# Patient Record
Sex: Male | Born: 1995 | Race: White | Hispanic: No | Marital: Single | Smoking: Never smoker
Health system: Southern US, Community
[De-identification: ages and names within clinical notes are randomized; demographics above are authoritative.]

---
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
## 2016-01-13 IMAGING — CR DG ANKLE COMPLETE 3+V*R*
3 series · 3 of 3 positions shown · non-contrast
Comparison: None.

CLINICAL DATA: Pain after running. Twisted ankle. Symptoms for 6
weeks. Initial evaluation.

EXAM:
RIGHT ANKLE - COMPLETE 3+ VIEW

[view not recorded (1 of 3)]
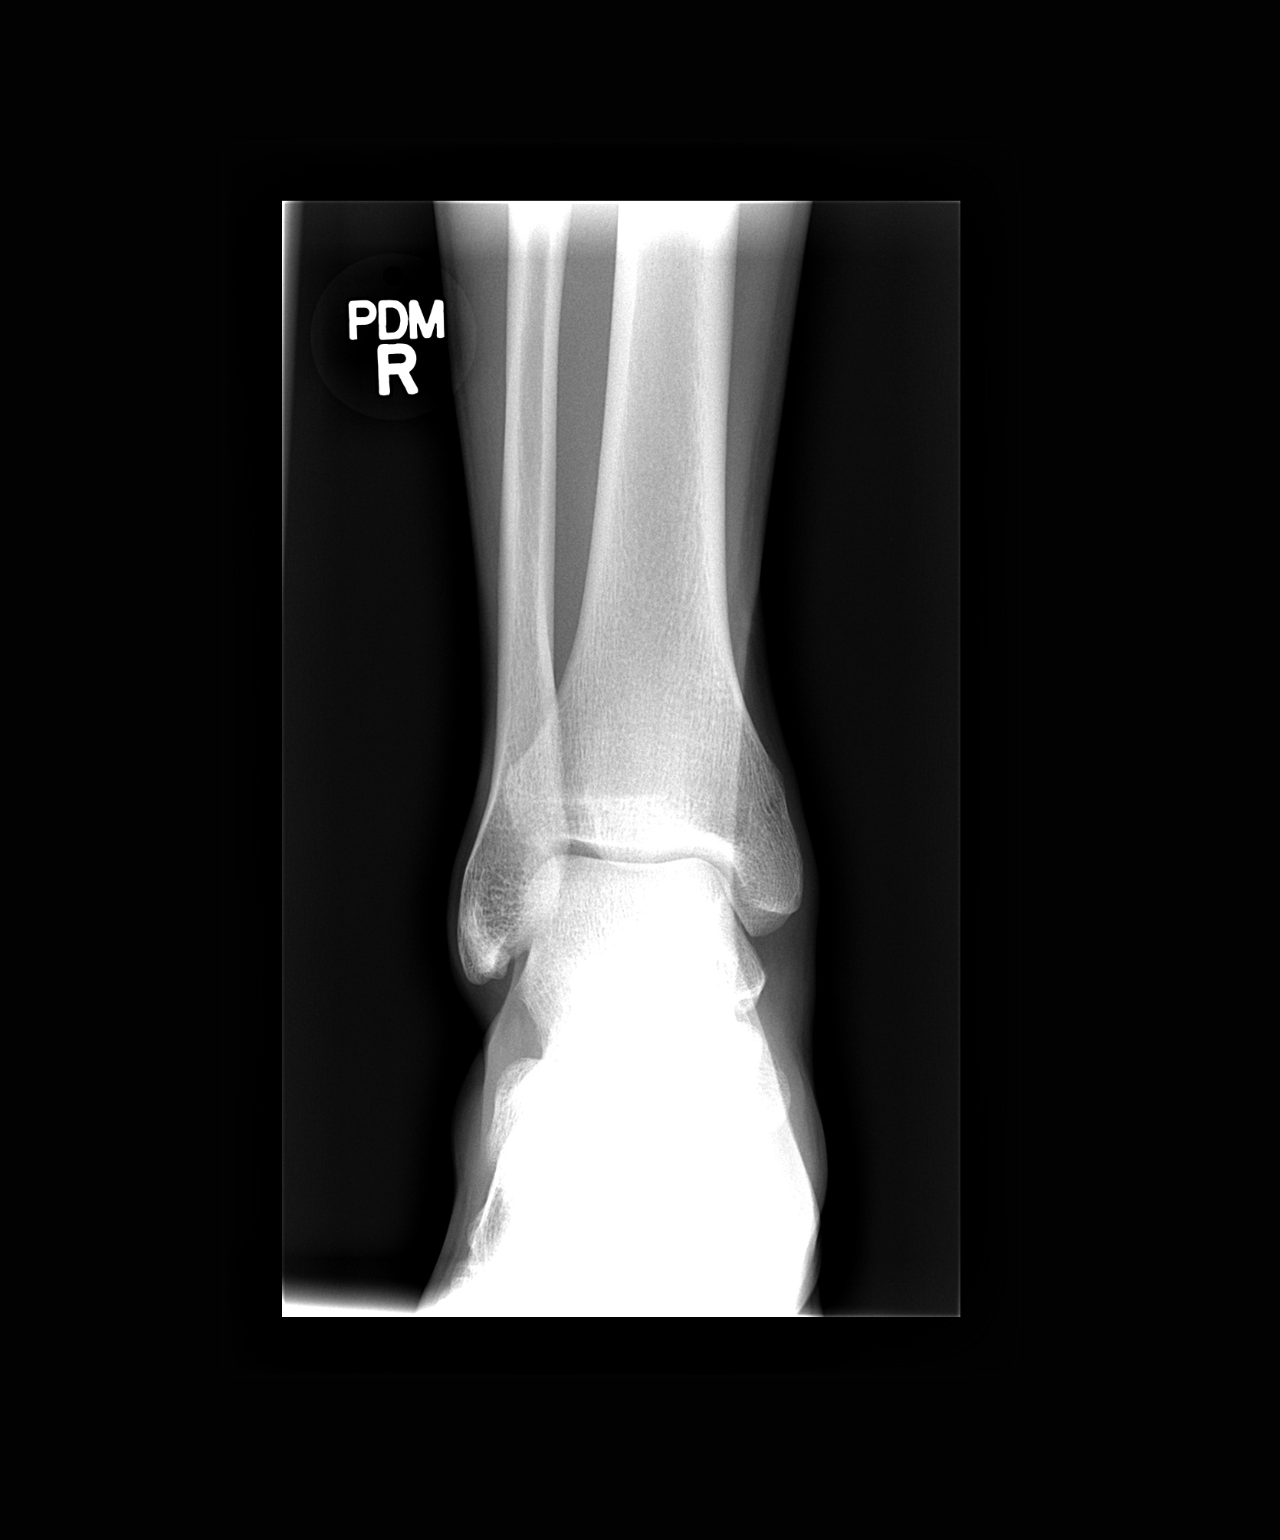

[view not recorded (2 of 3)]
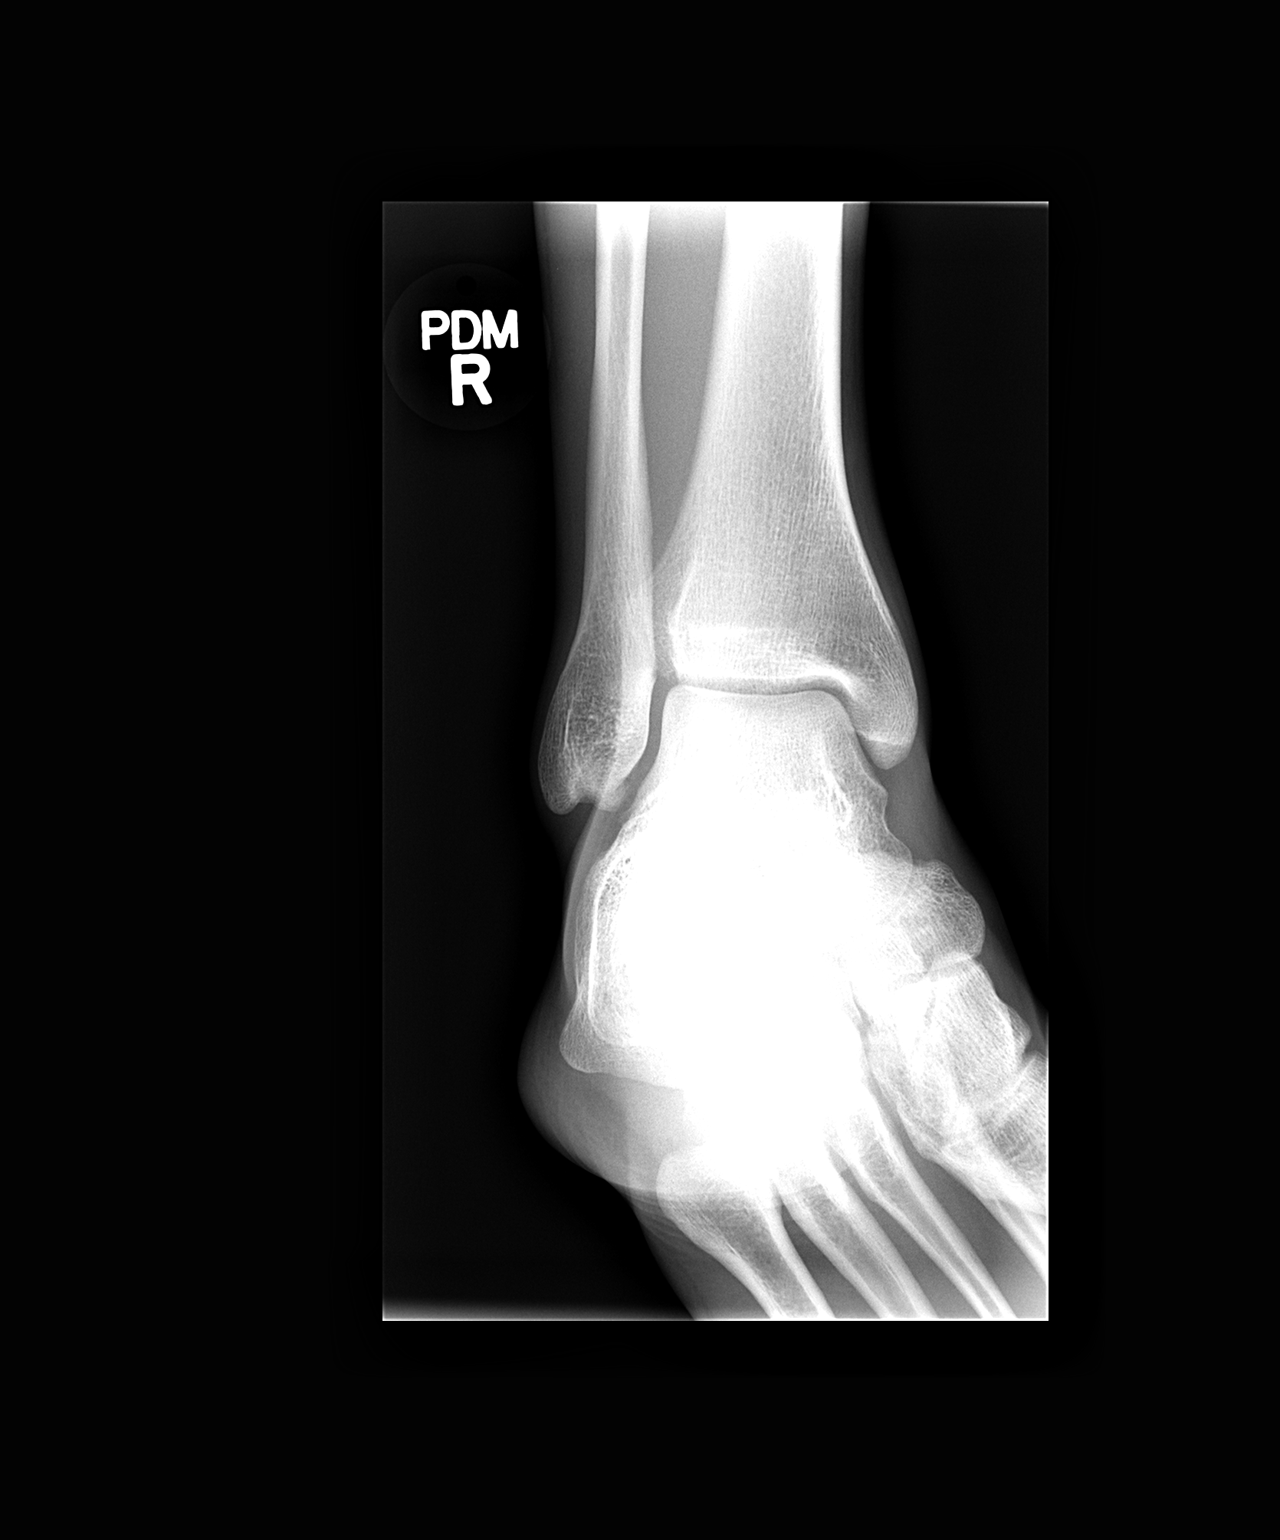

[view not recorded (3 of 3)]
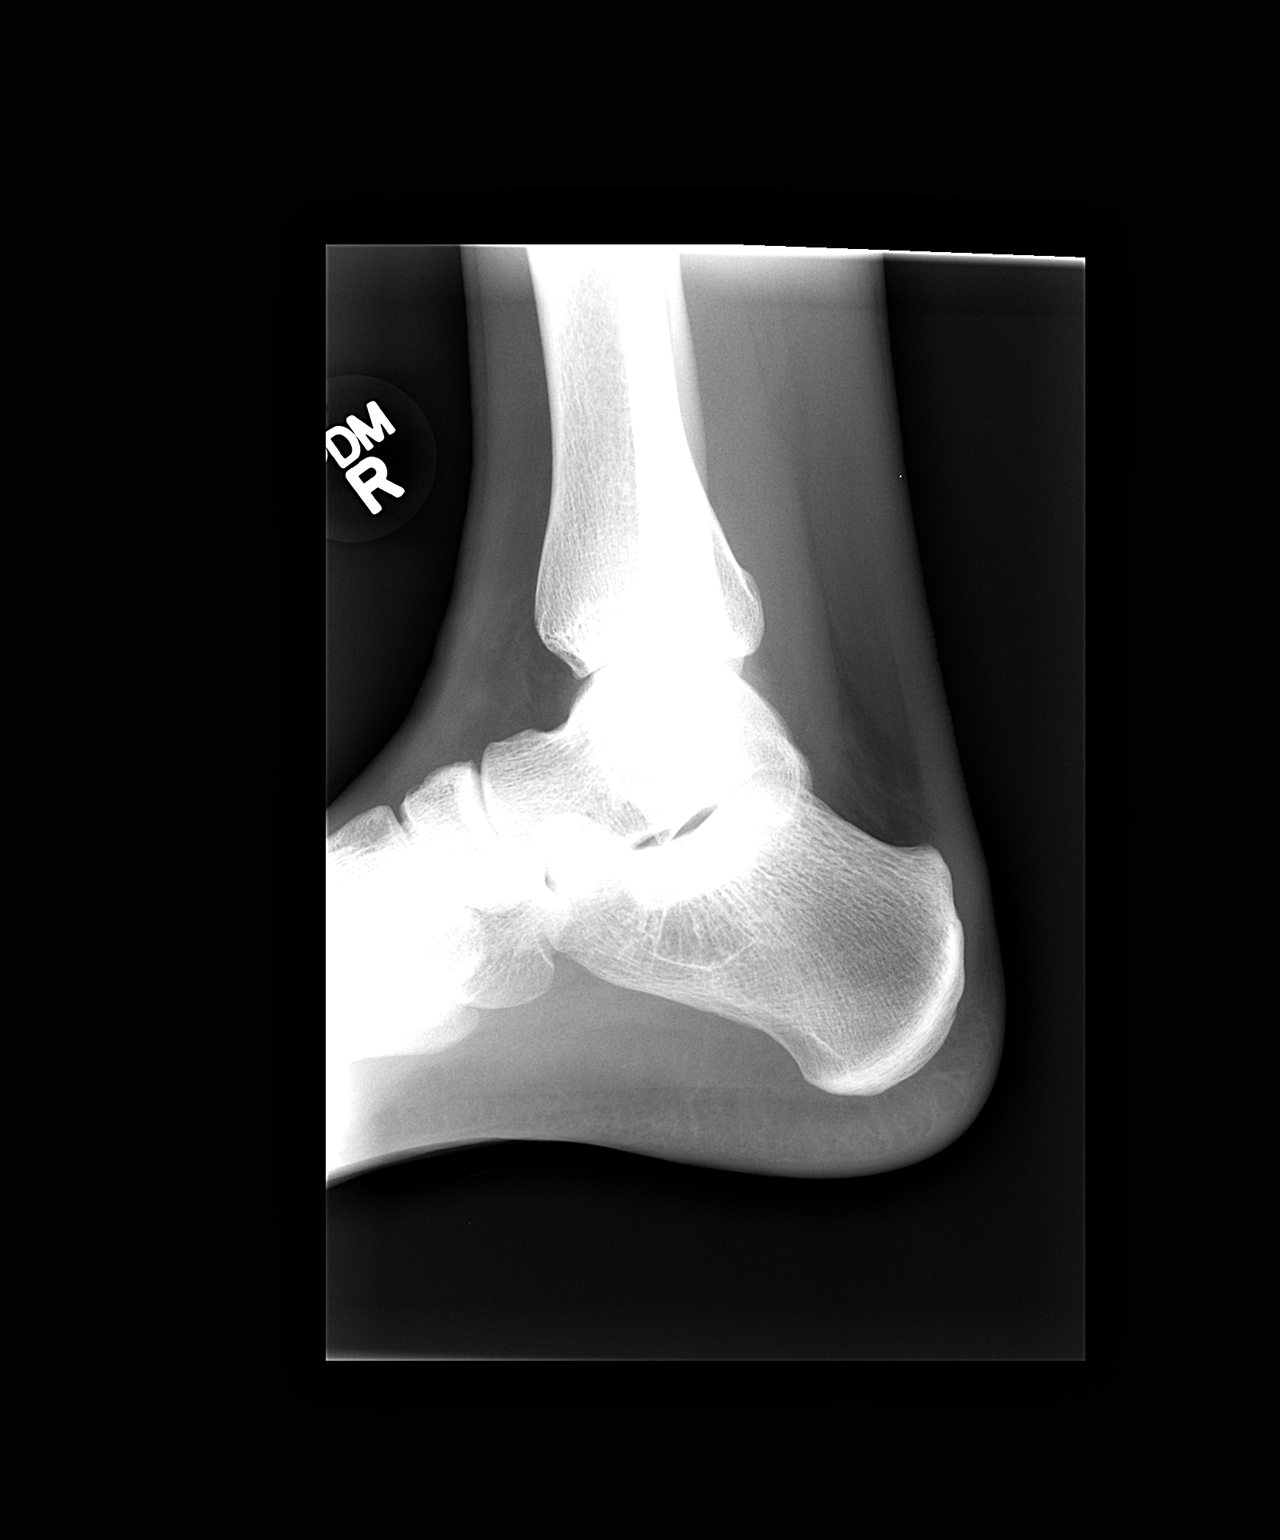

[3 of 3 positions shown; findings below may reference images not displayed]

FINDINGS: There is no evidence of fracture, dislocation, or joint effusion.
There is no evidence of arthropathy or other focal bone abnormality.
Soft tissues are unremarkable.
IMPRESSION: Negative.

## 2016-11-18 ENCOUNTER — Telehealth: Payer: Self-pay | Admitting: Family Medicine

## 2016-11-18 NOTE — Telephone Encounter (Signed)
Followed up with mother to advise that Dr. Katrinka BlazingSmith would not be able to see patient for this issue.   I did speak with Marcos EkeGreg Calone.  He stated he would work patient in to establish care next week if needed but would have to refer patient to urologist. Stated the most could do would be an ultrasound. Tammy SoursGreg did advise that patient might want to consult insurance and schedule straight with a urologist first if he was not interested in establishing care right now.  Patients mother is going to check with insurance and will follow back up if she needs to make patient an appointment.

## 2016-11-18 NOTE — Telephone Encounter (Signed)
Pt mother called wanting to get an appt with Katrinka BlazingSmith, she states her son has a growth on his testicles.  I informed her I do not believe Katrinka BlazingSmith will see him for this condition, he is strictly sports medicine. She said he does not have a primary care doctor right now, I informed her he could establish here with someone, she said she would still like a call from. you for a suggestion and advice, she doesn't want to just pick anyone to establish with. I did not get the chance to tell her who here are excepting new patients here which is Calone and Nche (Nche is leaving in October). She would like a call back sometime next week.

## 2019-11-13 ENCOUNTER — Ambulatory Visit (INDEPENDENT_AMBULATORY_CARE_PROVIDER_SITE_OTHER): Payer: 59 | Admitting: Family Medicine

## 2019-11-13 ENCOUNTER — Encounter: Payer: Self-pay | Admitting: Family Medicine

## 2019-11-13 ENCOUNTER — Other Ambulatory Visit: Payer: Self-pay

## 2019-11-13 VITALS — BP 124/80 | HR 62 | Ht 71.0 in | Wt 145.0 lb

## 2019-11-13 DIAGNOSIS — M999 Biomechanical lesion, unspecified: Secondary | ICD-10-CM

## 2019-11-13 DIAGNOSIS — M62838 Other muscle spasm: Secondary | ICD-10-CM | POA: Diagnosis not present

## 2019-11-13 NOTE — Patient Instructions (Signed)
See me again in 5 weeks 

## 2019-11-13 NOTE — Assessment & Plan Note (Signed)
   Decision today to treat with OMT was based on Physical Exam  After verbal consent patient was treated with HVLA, ME, FPR techniques in cervical, thoracic,  lumbar and sacral areas, all areas are chronic   Patient tolerated the procedure well with improvement in symptoms  Patient given exercises, stretches and lifestyle modifications  See medications in patient instructions if given  Patient will follow up in 4-8 weeks 

## 2019-11-13 NOTE — Progress Notes (Signed)
[place] Phone: [phone] Subjective:   [name], am serving as a scribe for Dr. Hulan Saas. This visit occurred during the SARS-CoV-2 public health emergency.  Safety protocols were in place, including screening questions prior to the visit, additional usage of staff PPE, and extensive cleaning of exam room while observing appropriate contact time as indicated for disinfecting solutions.   I'm seeing this patient by the request  of:  System, Pcp Not In  CC: Low back pain  [name]  Cameron Stevens is a 24 y.o. male coming in with complaint of back pain. OMT last performed in 2016. Patient states left cervical spine and left trapezius. Pain began on Monday. Put weights into backpack and felt a pull in neck. Pain with rotation. Denies any radiating symptoms. Not taking any medications for pain.  Patient states that it is more annoying than anything else.  Can be very uncomfortable at night.  No radiation to any of the extremities.  No significant headaches that is associated with it.  No visual changes.      No past medical history on file. No past surgical history on file. Social History   Socioeconomic History  . Marital status: Single    Spouse name: Not on file  . Number of children: Not on file  . Years of education: Not on file  . Highest education level: Not on file  Occupational History  . Not on file  Tobacco Use  . Smoking status: Never Smoker  Substance and Sexual Activity  . Alcohol use: Not on file  . Drug use: Not on file  . Sexual activity: Not on file  Other Topics Concern  . Not on file  Social History Narrative  . Not on file   Social Determinants of Health   Financial Resource Strain:   . Difficulty of Paying Living Expenses:   Food Insecurity:   . Worried About Charity fundraiser in the Last Year:   . Arboriculturist in the Last Year:   Transportation Needs:     . Film/video editor (Medical):   Marland Kitchen Lack of Transportation (Non-Medical):   Physical Activity:   . Days of Exercise per Week:   . Minutes of Exercise per Session:   Stress:   . Feeling of Stress :   Social Connections:   . Frequency of Communication with Friends and Family:   . Frequency of Social Gatherings with Friends and Family:   . Attends Religious Services:   . Active Member of Clubs or Organizations:   . Attends Archivist Meetings:   Marland Kitchen Marital Status:    Not on File No family history on file. No current outpatient medications on file.   Reviewed prior external information including notes and imaging from  primary care provider As well as notes that were available from care everywhere and other healthcare systems.  Past medical history, social, surgical and family history all reviewed in electronic medical record.  No pertanent information unless stated regarding to the chief complaint.   Review of Systems:  No headache, visual changes, nausea, vomiting, diarrhea, constipation, dizziness, abdominal pain, skin rash, fevers, chills, night sweats, weight loss, swollen lymph nodes, body aches, joint swelling, chest pain, shortness of breath, mood changes. POSITIVE muscle aches  Objective  Blood pressure 124/80, pulse 62, height 5\' 11"  (1.803 m), weight 145 lb (65.8 kg), SpO2 98 %.   General: No  apparent distress alert and oriented x3 mood and affect normal, dressed appropriately.  HEENT: Pupils equal, extraocular movements intact  Respiratory: Patient's speak in full sentences and does not appear short of breath  Cardiovascular: No lower extremity edema, non tender, no erythema  Neuro: Cranial nerves II through XII are intact, neurovascularly intact in all extremities with 2+ DTRs and 2+ pulses.  Gait normal with good balance and coordination.  MSK:  Non tender with full range of motion and good stability and symmetric strength and tone of shoulders, elbows,  wrist, hip, knee and ankles bilaterally.  Neck exam shows the patient does have some mild loss of lordosis.  Patient is some tender to palpation over the paraspinal musculature of the cervical spine.  Patient has a negative Spurling's.  Does have some mild limited sidebending and rotation to the left noted.  Osteopathic findings C4 flexed rotated and side bent left C6 flexed rotated and side bent left T9 extended rotated and side bent left L2 flexed rotated and side bent right Sacrum right on right    Impression and Recommendations:     This case required medical decision making of moderate complexity. The above documentation has been reviewed and is accurate and complete [name], DO       Note: This dictation was prepared with Dragon dictation along with smaller phrase technology. Any transcriptional errors that result from this process are unintentional.

## 2019-11-13 NOTE — Assessment & Plan Note (Addendum)
Patient interviewed with more than acute neck spasm  Discussed posture and ergonomics and scapular stability exercises that I think will be beneficial.  Discussed home exercise, icing regimen, which activities to do which wants to avoid.  Patient is to increase activity slowly over the course the next several weeks.  Patient will follow up with me again in 4 to 6 weeks

## 2019-12-31 ENCOUNTER — Ambulatory Visit: Payer: 59 | Admitting: Family Medicine

## 2019-12-31 NOTE — Progress Notes (Deleted)
°  [facility] [address] [place] [address] Phone: [phone] Subjective:    I'm seeing this patient by the request  of:  System, Pcp Not In  CC:   [id number]  Iva Posten is a 24 y.o. male coming in with complaint of back and neck pain. OMT 11/13/2019. Patient states   Medications patient has been prescribed:   Taking:         Reviewed prior external information including notes and imaging from previsou exam, outside providers and external EMR if available.   As well as notes that were available from care everywhere and other healthcare systems.  Past medical history, social, surgical and family history all reviewed in electronic medical record.  No pertanent information unless stated regarding to the chief complaint.   No past medical history on file.  Not on File   Review of Systems:  No headache, visual changes, nausea, vomiting, diarrhea, constipation, dizziness, abdominal pain, skin rash, fevers, chills, night sweats, weight loss, swollen lymph nodes, body aches, joint swelling, chest pain, shortness of breath, mood changes. POSITIVE muscle aches  Objective  There were no vitals taken for this visit.   General: No apparent distress alert and oriented x3 mood and affect normal, dressed appropriately.  HEENT: Pupils equal, extraocular movements intact  Respiratory: Patient's speak in full sentences and does not appear short of breath  Cardiovascular: No lower extremity edema, non tender, no erythema  Neuro: Cranial nerves II through XII are intact, neurovascularly intact in all extremities with 2+ DTRs and 2+ pulses.  Gait normal with good balance and coordination.  MSK:  Non tender with full range of motion and good stability and symmetric strength and tone of shoulders, elbows, wrist, hip, knee and ankles bilaterally.  Back - Normal skin, Spine with normal alignment and no deformity.  No tenderness to vertebral process  palpation.  Paraspinous muscles are not tender and without spasm.   Range of motion is full at neck and lumbar sacral regions  Osteopathic findings  C2 flexed rotated and side bent right C6 flexed rotated and side bent left T3 extended rotated and side bent right inhaled rib T9 extended rotated and side bent left L2 flexed rotated and side bent right Sacrum right on right       Assessment and Plan:    Nonallopathic problems  Decision today to treat with OMT was based on Physical Exam  After verbal consent patient was treated with HVLA, ME, FPR techniques in cervical, rib, thoracic, lumbar, and sacral  areas  Patient tolerated the procedure well with improvement in symptoms  Patient given exercises, stretches and lifestyle modifications  See medications in patient instructions if given  Patient will follow up in 4-8 weeks      The above documentation has been reviewed and is accurate and complete Wilford Grist       Note: This dictation was prepared with Dragon dictation along with smaller phrase technology. Any transcriptional errors that result from this process are unintentional.
